# Patient Record
Sex: Male | Born: 2000 | Race: White | Hispanic: No | Marital: Single | Smoking: Never smoker
Health system: Southern US, Community
[De-identification: ages and names within clinical notes are randomized; demographics above are authoritative.]

## PROBLEM LIST (undated history)

## (undated) DIAGNOSIS — Q231 Congenital insufficiency of aortic valve: Secondary | ICD-10-CM

## (undated) DIAGNOSIS — Z8719 Personal history of other diseases of the digestive system: Secondary | ICD-10-CM

## (undated) DIAGNOSIS — F909 Attention-deficit hyperactivity disorder, unspecified type: Secondary | ICD-10-CM

## (undated) DIAGNOSIS — K200 Eosinophilic esophagitis: Secondary | ICD-10-CM

## (undated) DIAGNOSIS — Q249 Congenital malformation of heart, unspecified: Secondary | ICD-10-CM

## (undated) DIAGNOSIS — Q2381 Bicuspid aortic valve: Secondary | ICD-10-CM

## (undated) DIAGNOSIS — R51 Headache: Secondary | ICD-10-CM

## (undated) DIAGNOSIS — A4902 Methicillin resistant Staphylococcus aureus infection, unspecified site: Secondary | ICD-10-CM

## (undated) HISTORY — PX: TYMPANOSTOMY TUBE PLACEMENT: SHX32

## (undated) HISTORY — DX: Personal history of other diseases of the digestive system: Z87.19

## (undated) HISTORY — PX: HERNIA REPAIR: SHX51

## (undated) HISTORY — PX: UPPER GASTROINTESTINAL ENDOSCOPY: SHX188

## (undated) HISTORY — DX: Congenital malformation of heart, unspecified: Q24.9

## (undated) HISTORY — PX: TONSILLECTOMY: SUR1361

## (undated) HISTORY — PX: ADENOIDECTOMY: SUR15

---
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
## 2022-02-25 DIAGNOSIS — K922 Gastrointestinal hemorrhage, unspecified: Secondary | ICD-10-CM

## 2022-02-25 HISTORY — DX: Gastrointestinal hemorrhage, unspecified: K92.2

## 2022-05-20 ENCOUNTER — Telehealth: Payer: Self-pay | Admitting: Internal Medicine

## 2022-05-20 NOTE — Telephone Encounter (Signed)
Ok to schedule with me

## 2022-05-20 NOTE — Telephone Encounter (Signed)
Good Afternoon Dr. Henrene Pastor,  We received a referral for this patient to be seen for GERD and Schatzki's ring of distal esophagus.  The patient has GI hx at [facility] for pediatric GI. The patient is requesting to this transfer because he is no longer and minor and has requested to have you as his provider.  Records are available for review in Epic, Care Everywhere. Please advise on scheduling. Thank you.

## 2022-05-21 ENCOUNTER — Encounter: Payer: Self-pay | Admitting: Internal Medicine

## 2022-05-21 NOTE — Telephone Encounter (Signed)
Patient has been scheduled for 1/25 

## 2022-07-01 ENCOUNTER — Encounter: Payer: Self-pay | Admitting: Internal Medicine

## 2022-07-01 ENCOUNTER — Ambulatory Visit (INDEPENDENT_AMBULATORY_CARE_PROVIDER_SITE_OTHER): Payer: Medicaid Other | Admitting: Internal Medicine

## 2022-07-01 VITALS — BP 120/68 | HR 63 | Ht 66.0 in | Wt 110.0 lb

## 2022-07-01 DIAGNOSIS — Z9889 Other specified postprocedural states: Secondary | ICD-10-CM

## 2022-07-01 DIAGNOSIS — K200 Eosinophilic esophagitis: Secondary | ICD-10-CM | POA: Diagnosis not present

## 2022-07-01 DIAGNOSIS — K222 Esophageal obstruction: Secondary | ICD-10-CM | POA: Diagnosis not present

## 2022-07-01 DIAGNOSIS — R1319 Other dysphagia: Secondary | ICD-10-CM

## 2022-07-01 NOTE — Progress Notes (Signed)
HISTORY OF PRESENT ILLNESS:  Dennis English is a 21 y.o. male, high school graduate, with bicuspid aortic valve, history of cardiac arrhythmia, ADHD, asthma, and eosinophilic esophagitis who presents to this clinic regarding the latter and complaints of chronic recurrent dysphagia.  He is accompanied by his mother.  He is new to this practice.  The patient is reported to have had his initial episode of food impaction requiring medical attention via EGD with removal in 2013.  At one point he was diagnosed with eosinophilic esophagitis and GERD.  No details, but am told that he underwent Nissen fundoplication in February 2018.  This did not help.  He has had chronic recurrent problems with solid food dysphagia and food impactions.  He is undergone multiple endoscopies with dilation.  Most of his care has been at [facility].  For many years he had been on lansoprazole.  Recently changed to pantoprazole 40 mg daily.  No active reflux symptoms.  Review of recent records shows that the patient underwent double contrast upper GI series December 30, 2021.  He was said to have trace gastroesophageal reflux and intermittent moderate paraesophageal hernia.  This was concerning for failure of Nissen fundoplication.  Also, smooth stricture in the distal thoracic esophagus which impeded the passage of a 13 mm barium tablet.  Upper endoscopy last performed February 24, 2022.  He was balloon dilated to 19 mm.  There was mucosal disruption.  Post dilation his dysphagia is improved, typically for a month or so.  Apparently diagnosed with EOE.  He did see his surgeon in August (March 25, 2022).  At that time he was not felt to have any complication regarding his prior hernia repair with Nissen fundoplication.  The recommendation was for him to follow-up with GI regarding his dysphagia.  He aged out of the pediatric setting and was told to find an adult gastroenterologist.  Recent months he has had significant  recurrent dysphagia.  Trouble with foods such as chicken or chicken sandwich.  REVIEW OF SYSTEMS:  All non-GI ROS negative unless otherwise stated in the HPI except for irregular heartbeat.  Past Medical History:  Diagnosis Date   ADHD (attention deficit hyperactivity disorder)    Asthma    Bicuspid aortic valve    Cardiac arrhythmia due to congenital heart disease    Eosinophilic esophagitis    GI bleed 02/2022   Headache(784.0)    MRSA (methicillin resistant Staphylococcus aureus)    age 54, in bloodstream per mother   Status post dilation of esophageal narrowing     Past Surgical History:  Procedure Laterality Date   ADENOIDECTOMY     HERNIA REPAIR     TONSILLECTOMY     TYMPANOSTOMY TUBE PLACEMENT      Social History Dennis English  reports that he has never smoked. He has never used smokeless tobacco. No history on file for alcohol use and drug use.  family history includes ADD / ADHD in his brother; Asthma in his mother; Barrett's esophagus in his mother; Clotting disorder in his sister; Colon cancer in his maternal grandmother; Colon polyps in his maternal grandmother and mother; Diabetes in his brother and maternal grandmother; Heart disease in his maternal grandfather and maternal grandmother.  No Known Allergies     PHYSICAL EXAMINATION: Vital signs: BP 120/68   Pulse 63   Ht 5\' 6"  (1.676 m)   Wt 110 lb (49.9 kg)   SpO2 96%   BMI 17.75 kg/m   Constitutional:  Thin but generally well-appearing, no acute distress Psychiatric: alert and oriented x3, cooperative Eyes: extraocular movements intact, anicteric, conjunctiva pink Mouth: oral pharynx moist, no lesions Neck: supple no lymphadenopathy Cardiovascular: heart regular rate and rhythm, no murmur Lungs: clear to auscultation bilaterally Abdomen: soft, nontender, nondistended, no obvious ascites, no peritoneal signs, normal bowel sounds, no organomegaly Rectal: Omitted Extremities: no clubbing, cyanosis,  or lower extremity edema bilaterally Skin: no lesions on visible extremities Neuro: No focal deficits.  Cranial nerves intact  ASSESSMENT:  1.  Recurrent esophageal stricture with food impaction.  Likely secondary to EOE esophagus.  Question GERD with secondary EOE. 2.  History of hiatal hernia status post hernia repair with Nissen fundoplication 3.  General medical problems as listed 4.  Significant recurrent dysphagia   PLAN:  1.  Continue PPI 2.  Schedule upper endoscopy with biopsies and esophageal dilation.The nature of the procedure, as well as the risks, benefits, and alternatives were carefully and thoroughly reviewed with the patient. Ample time for discussion and questions allowed. The patient understood, was satisfied, and agreed to proceed. 3.  Chew food extremely well in the interim

## 2022-07-01 NOTE — Patient Instructions (Signed)
_______________________________________________________  If you are age 19 or older, your body mass index should be between 23-30. Your Body mass index is 17.75 kg/m. If this is out of the aforementioned range listed, please consider follow up with your Primary Care Provider.  If you are age 47 or younger, your body mass index should be between 19-25. Your Body mass index is 17.75 kg/m. If this is out of the aformentioned range listed, please consider follow up with your Primary Care Provider.   ________________________________________________________  The [facility] providers would like to encourage you to use [facility] to communicate with providers for non-urgent requests or questions.  Due to long hold times on the telephone, sending your provider a message by [facility] may be a faster and more efficient way to get a response.  Please allow 48 business hours for a response.  Please remember that this is for non-urgent requests.  _______________________________________________________  Dennis English have been scheduled for a colonoscopy. Please follow written instructions given to you at your visit today.  Please pick up your prep supplies at the pharmacy within the next 1-3 days. If you use inhalers (even only as needed), please bring them with you on the day of your procedure.

## 2022-07-09 ENCOUNTER — Encounter: Payer: Self-pay | Admitting: Internal Medicine

## 2022-07-09 ENCOUNTER — Ambulatory Visit (AMBULATORY_SURGERY_CENTER): Payer: Medicaid Other | Admitting: Internal Medicine

## 2022-07-09 VITALS — BP 93/55 | HR 67 | Temp 97.8°F | Resp 14 | Ht 66.0 in | Wt 110.0 lb

## 2022-07-09 DIAGNOSIS — R1319 Other dysphagia: Secondary | ICD-10-CM

## 2022-07-09 DIAGNOSIS — Z9889 Other specified postprocedural states: Secondary | ICD-10-CM | POA: Diagnosis not present

## 2022-07-09 DIAGNOSIS — R1314 Dysphagia, pharyngoesophageal phase: Secondary | ICD-10-CM | POA: Diagnosis not present

## 2022-07-09 DIAGNOSIS — K200 Eosinophilic esophagitis: Secondary | ICD-10-CM

## 2022-07-09 DIAGNOSIS — K222 Esophageal obstruction: Secondary | ICD-10-CM

## 2022-07-09 MED ORDER — SODIUM CHLORIDE 0.9 % IV SOLN: 500.0000 mL | Freq: Once | INTRAVENOUS | Status: DC

## 2022-07-09 NOTE — Op Note (Signed)
[facility] Patient Name: Dennis English Procedure Date: 07/09/2022 1:18 PM MRN: [id number] Endoscopist: Wilhemina Bonito. Dennis English , MD, [id number] Age: 21 Referring MD:  Date of Birth: 04-05-2001 Gender: Male Account #: [id number] Procedure:                Upper GI endoscopy with biopsies; Maloney dilation                            of the esophagus-32F,9F Indications:              Dysphagia, Therapeutic procedure Medicines:                Monitored Anesthesia Care Procedure:                Pre-Anesthesia Assessment:                           - Prior to the procedure, a History and Physical                            was performed, and patient medications and                            allergies were reviewed. The patient's tolerance of                            previous anesthesia was also reviewed. The risks                            and benefits of the procedure and the sedation                            options and risks were discussed with the patient.                            All questions were answered, and informed consent                            was obtained. Prior Anticoagulants: The patient has                            taken no anticoagulant or antiplatelet agents. ASA                            Grade Assessment: II - A patient with mild systemic                            disease. After reviewing the risks and benefits,                            the patient was deemed in satisfactory condition to                            undergo the procedure.  After obtaining informed consent, the endoscope was                            passed under direct vision. Throughout the                            procedure, the patient's blood pressure, pulse, and                            oxygen saturations were monitored continuously. The                            GIF [id number] #[id number] was introduced through the                            mouth, and  advanced to the second part of duodenum.                            The upper GI endoscopy was accomplished without                            difficulty. The patient tolerated the procedure                            well. Scope In: Scope Out: Findings:                 Multiple benign-appearing, intrinsic moderate                            stenoses were found throughout the esophagus.                            biopsies taken. The narrowest stenosis measured 1.2                            cm (inner diameter). The scope was withdrawn.                            Dilation was performed with a Maloney dilator with                            no resistance at 42 and 44 Fr. Resistance at 25F                            (incomplete). + heme                           The exam of the esophagus was otherwise normal. GEJ                            at 35 cm.                           The stomach revealed prior fundoplication. slipped  with Hiatal hernia..                           The examined duodenum was normal.                           The cardia and gastric fundus were normal on                            retroflexion, save above. Complications:            No immediate complications. Estimated Blood Loss:     Estimated blood loss: none. Impression:               - Benign-appearing esophageal stenoses. biopsied.                            Dilated.                           - Prior fundoplication slipped; H/H..                           - Normal examined duodenum.                           - No specimens collected. Recommendation:           1. Patient has a contact number available for                            emergencies. The signs and symptoms of potential                            delayed complications were discussed with the                            patient. Return to normal activities tomorrow.                            Written discharge instructions were  provided to the                            patient.                           2. post dilation diet.                           3. Continue present medications.                           4. Await pathology results.                           5, Repeat EGD with dilation in one month Dennis English N. Dennis Goodell, MD 07/09/2022 2:03:30 PM This report has been signed electronically.

## 2022-07-09 NOTE — Patient Instructions (Addendum)
Handout on post dilation diet given to patient. Await pathology results. Continue present medications. Repeat EGD with dilation in one month. Scheduled for January 17th, 2024 @ 4 pm (please arrive at 3pm - instructions included)   YOU HAD AN ENDOSCOPIC PROCEDURE TODAY AT THE [facility]:   Refer to the procedure report that was given to you for any specific questions about what was found during the examination.  If the procedure report does not answer your questions, please call your gastroenterologist to clarify.  If you requested that your care partner not be given the details of your procedure findings, then the procedure report has been included in a sealed envelope for you to review at your convenience later.  YOU SHOULD EXPECT: Some feelings of bloating in the abdomen. Passage of more gas than usual.  Walking can help get rid of the air that was put into your GI tract during the procedure and reduce the bloating. If you had a lower endoscopy (such as a colonoscopy or flexible sigmoidoscopy) you may notice spotting of blood in your stool or on the toilet paper. If you underwent a bowel prep for your procedure, you may not have a normal bowel movement for a few days.  Please Note:  You might notice some irritation and congestion in your nose or some drainage.  This is from the oxygen used during your procedure.  There is no need for concern and it should clear up in a day or so.  SYMPTOMS TO REPORT IMMEDIATELY:   Following upper endoscopy (EGD)  Vomiting of blood or coffee ground material  New chest pain or pain under the shoulder blades  Painful or persistently difficult swallowing  New shortness of breath  Fever of 100F or higher  Black, tarry-looking stools  For urgent or emergent issues, a gastroenterologist can be reached at any hour by calling [phone]. Do not use MyChart messaging for urgent concerns.    DIET:  We do recommend a small meal at first, but then  you may proceed to your regular diet.  Drink plenty of fluids but you should avoid alcoholic beverages for 24 hours.  ACTIVITY:  You should plan to take it easy for the rest of today and you should NOT DRIVE or use heavy machinery until tomorrow (because of the sedation medicines used during the test).    FOLLOW UP: Our staff will call the number listed on your records the next business day following your procedure.  We will call around 7:15- 8:00 am to check on you and address any questions or concerns that you may have regarding the information given to you following your procedure. If we do not reach you, we will leave a message.     If any biopsies were taken you will be contacted by phone or by letter within the next 1-3 weeks.  Please call us at [phone] if you have not heard about the biopsies in 3 weeks.    SIGNATURES/CONFIDENTIALITY: You and/or your care partner have signed paperwork which will be entered into your electronic medical record.  These signatures attest to the fact that that the information above on your After Visit Summary has been reviewed and is understood.  Full responsibility of the confidentiality of this discharge information lies with you and/or your care-partner.

## 2022-07-09 NOTE — Progress Notes (Signed)
Vitals-DT  Pt's states no medical or surgical changes since previsit or office visit.  

## 2022-07-09 NOTE — Progress Notes (Signed)
HISTORY OF PRESENT ILLNESS:   Dennis English is a 21 y.o. male, high school graduate, with bicuspid aortic valve, history of cardiac arrhythmia, ADHD, asthma, and eosinophilic esophagitis who presents to this clinic regarding the latter and complaints of chronic recurrent dysphagia.  He is accompanied by his mother.  He is new to this practice.   The patient is reported to have had his initial episode of food impaction requiring medical attention via EGD with removal in 2013.  At one point he was diagnosed with eosinophilic esophagitis and GERD.  No details, but am told that he underwent Nissen fundoplication in February 2018.  This did not help.  He has had chronic recurrent problems with solid food dysphagia and food impactions.  He is undergone multiple endoscopies with dilation.  Most of his care has been at [facility].  For many years he had been on lansoprazole.  Recently changed to pantoprazole 40 mg daily.  No active reflux symptoms.   Review of recent records shows that the patient underwent double contrast upper GI series December 30, 2021.  He was said to have trace gastroesophageal reflux and intermittent moderate paraesophageal hernia.  This was concerning for failure of Nissen fundoplication.  Also, smooth stricture in the distal thoracic esophagus which impeded the passage of a 13 mm barium tablet.   Upper endoscopy last performed February 24, 2022.  He was balloon dilated to 19 mm.  There was mucosal disruption.  Post dilation his dysphagia is improved, typically for a month or so.  Apparently diagnosed with EOE.   He did see his surgeon in August (March 25, 2022).  At that time he was not felt to have any complication regarding his prior hernia repair with Nissen fundoplication.  The recommendation was for him to follow-up with GI regarding his dysphagia.  He aged out of the pediatric setting and was told to find an adult gastroenterologist.  Recent months he has had  significant recurrent dysphagia.  Trouble with foods such as chicken or chicken sandwich.   REVIEW OF SYSTEMS:   All non-GI ROS negative unless otherwise stated in the HPI except for irregular heartbeat.       Past Medical History:  Diagnosis Date   ADHD (attention deficit hyperactivity disorder)     Asthma     Bicuspid aortic valve     Cardiac arrhythmia due to congenital heart disease     Eosinophilic esophagitis     GI bleed 02/2022   Headache(784.0)     MRSA (methicillin resistant Staphylococcus aureus)      age 47, in bloodstream per mother   Status post dilation of esophageal narrowing             Past Surgical History:  Procedure Laterality Date   ADENOIDECTOMY       HERNIA REPAIR       TONSILLECTOMY       TYMPANOSTOMY TUBE PLACEMENT          Social History ALEXANDRE FARIES  reports that he has never smoked. He has never used smokeless tobacco. No history on file for alcohol use and drug use.   family history includes ADD / ADHD in his brother; Asthma in his mother; Barrett's esophagus in his mother; Clotting disorder in his sister; Colon cancer in his maternal grandmother; Colon polyps in his maternal grandmother and mother; Diabetes in his brother and maternal grandmother; Heart disease in his maternal grandfather and maternal grandmother.   No Known Allergies  PHYSICAL EXAMINATION: Vital signs: BP 120/68   Pulse 63   Ht 5\' 6"  (1.676 m)   Wt 110 lb (49.9 kg)   SpO2 96%   BMI 17.75 kg/m   Constitutional: Thin but generally well-appearing, no acute distress Psychiatric: alert and oriented x3, cooperative Eyes: extraocular movements intact, anicteric, conjunctiva pink Mouth: oral pharynx moist, no lesions Neck: supple no lymphadenopathy Cardiovascular: heart regular rate and rhythm, no murmur Lungs: clear to auscultation bilaterally Abdomen: soft, nontender, nondistended, no obvious ascites, no peritoneal signs, normal bowel sounds, no  organomegaly Rectal: Omitted Extremities: no clubbing, cyanosis, or lower extremity edema bilaterally Skin: no lesions on visible extremities Neuro: No focal deficits.  Cranial nerves intact   ASSESSMENT:   1.  Recurrent esophageal stricture with food impaction.  Likely secondary to EOE esophagus.  Question GERD with secondary EOE. 2.  History of hiatal hernia status post hernia repair with Nissen fundoplication 3.  General medical problems as listed 4.  Significant recurrent dysphagia     PLAN:   1.  Continue PPI 2.  Schedule upper endoscopy with biopsies and esophageal dilation.The nature of the procedure, as well as the risks, benefits, and alternatives were carefully and thoroughly reviewed with the patient. Ample time for discussion and questions allowed. The patient understood, was satisfied, and agreed to proceed. 3.  Chew food extremely well in the interim  ADDENDUM: Upper endoscopy report from January 14, 2022 obtained and reviewed.  Patient was found to have a high-grade esophageal stricture measuring less than 10 mm.  Dilated to 15 mm with mucosal disruption.

## 2022-07-09 NOTE — Progress Notes (Signed)
A and O x3. Report to RN. Tolerated MAC anesthesia well.Teeth unchanged after procedure. 

## 2022-07-09 NOTE — Progress Notes (Signed)
Called to room to assist during endoscopic procedure.  Patient ID and intended procedure confirmed with present staff. Received instructions for my participation in the procedure from the performing physician.  

## 2022-07-10 ENCOUNTER — Telehealth: Payer: Self-pay

## 2022-07-10 NOTE — Telephone Encounter (Signed)
Post procedure follow up call, no answer 

## 2022-07-12 ENCOUNTER — Ambulatory Visit
Admission: EM | Admit: 2022-07-12 | Discharge: 2022-07-12 | Disposition: A | Discharge status: Home / Self Care | Payer: Medicaid Other | Attending: Internal Medicine | Admitting: Internal Medicine

## 2022-07-12 DIAGNOSIS — N3001 Acute cystitis with hematuria: Secondary | ICD-10-CM | POA: Insufficient documentation

## 2022-07-12 DIAGNOSIS — R300 Dysuria: Secondary | ICD-10-CM | POA: Diagnosis present

## 2022-07-12 DIAGNOSIS — U071 COVID-19: Secondary | ICD-10-CM | POA: Insufficient documentation

## 2022-07-12 LAB — POCT URINALYSIS DIP (MANUAL ENTRY)
Glucose, UA: NEGATIVE mg/dL
Nitrite, UA: POSITIVE — AB
Protein Ur, POC: >=300 mg/dL — AB
Spec Grav, UA: >=1.03 — AB (ref 1.010–1.025)
Urobilinogen, UA: 1 E.U./dL
pH, UA: 6 (ref 5.0–8.0)

## 2022-07-12 MED ORDER — ACETAMINOPHEN 325 MG PO TABS
650.0000 mg | ORAL_TABLET | Freq: Once | ORAL | Status: AC
  Administered 2022-07-12: 650 mg via ORAL

## 2022-07-12 MED ORDER — SODIUM CHLORIDE 0.9 % IV BOLUS
1000.0000 mL | Freq: Once | INTRAVENOUS | Status: AC
  Administered 2022-07-12: 1000 mL via INTRAVENOUS

## 2022-07-12 MED ORDER — SULFAMETHOXAZOLE-TRIMETHOPRIM 800-160 MG PO TABS: 1.0000 | ORAL_TABLET | Freq: Two times a day (BID) | ORAL | 0 refills | Status: AC

## 2022-07-12 NOTE — Discharge Instructions (Signed)
COVID-19 is treated with supportive care and symptom management as we discussed.  If symptoms persist or worsen, please follow-up for further evaluation and management.  Recommend that you drink plenty of fluids and get rest.  It appears that he has a urinary tract infection so this is being treated with an antibiotic.  Urine culture is pending.  Will call with results.  Please follow-up if these symptoms persist or worsen as well.

## 2022-07-12 NOTE — ED Triage Notes (Addendum)
Pt presents to [facility] with mother, pt reports cough congestion and ha for one week and new onset of dysuria since last night, pt denies any chance for STI but did state he had some penile discharge.    Pt is covid + per mother

## 2022-07-12 NOTE — ED Provider Notes (Signed)
[facility]    CSN: [id number] Arrival date & time: 07/12/22  1134      History   Chief Complaint Chief Complaint  Patient presents with   Dysuria   URI    HPI Dennis English is a 21 y.o. male.   Patient presents with 2 different chief complaints today.  Patient reports that he recently tested positive for COVID-19 at PCP office visit approximately 3 days ago.  Patient reports that no medications were prescribed at that time.  He has been using over-the-counter cold and flu medications with temporary improvement in symptoms.  Patient denies history of asthma.  Reports fever at home but is not sure temp max.  He denies chest pain, shortness of breath, sore throat, ear pain, nausea, vomiting, diarrhea, abdominal pain.  Grandfather also tested positive for COVID-19.  Patient reporting urinary burning, urinary hesitancy, urinary frequency that started yesterday.  Patient also reports some penile discharge.  Denies abdominal pain, back pain, fever, testicular pain.  Patient adamantly declines that he is sexually active or has any confirmed exposure to STD.  Denies history of frequent urinary tract infection.  Patient reports that he has been taking baths recently since he has been sick so is not sure if the symptoms are related to that.   Dysuria URI   Past Medical History:  Diagnosis Date   ADHD (attention deficit hyperactivity disorder)    Asthma    Bicuspid aortic valve    Cardiac arrhythmia due to congenital heart disease    Eosinophilic esophagitis    GI bleed 02/2022   Headache(784.0)    MRSA (methicillin resistant Staphylococcus aureus)    age 55, in bloodstream per mother   Status post dilation of esophageal narrowing     Patient Active Problem List   Diagnosis Date Noted   Nausea with vomiting    Migraine without aura, without mention of intractable migraine without mention of status migrainosus 01/13/2013   Persistent vomiting 01/13/2013   Variants  of migraine, not elsewhere classified, without mention of intractable migraine without mention of status migrainosus 01/13/2013   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 Authorizing Provider  sulfamethoxazole-trimethoprim (BACTRIM DS) 800-160 MG tablet Take 1 tablet by mouth 2 (two) times daily for 7 days. 07/12/22 07/19/22 Yes Alyxander Kollmann, Acie Fredrickson, FNP  cyproheptadine (PERIACTIN) 4 MG tablet Take 4 mg by mouth 2 (two) times daily. 07/17/21   [provider]  lansoprazole (PREVACID) 30 MG capsule Take 30 mg by mouth 2 (two) times daily. 07/17/21   [provider]  lisdexamfetamine (VYVANSE) 20 MG capsule Take 20 mg by mouth daily. Patient not taking: Reported on 08/12/2021    [provider]  loratadine (CLARITIN) 10 MG tablet Take 10 mg by mouth daily as needed. 04/29/21   [provider]  ondansetron (ZOFRAN ODT) 4 MG disintegrating tablet Take 1 tablet (4 mg total) by mouth every 6 (six) hours as needed for nausea or vomiting. 09/06/17   Lowanda Foster, NP  pantoprazole (PROTONIX) 40 MG tablet Take 40 mg by mouth 2 (two) times daily.    [provider]  PROAIR HFA 108 [phone] Base) MCG/ACT inhaler 2 Puff(s) By Mouth Every 4-6 Hours PRN Patient not taking: Reported on 07/01/2022 04/28/21   [provider]  UNKNOWN TO PATIENT Take 1 capsule by mouth daily. Patient not taking: Reported on 08/12/2021    [provider]    Family History Family History  Problem Relation Age of Onset   Asthma Mother     Colon polyps Mother    Barrett's esophagus Mother    Clotting disorder Sister    ADD / ADHD Brother    Diabetes Brother    Heart disease Maternal Grandmother    Colon cancer Maternal Grandmother    Colon polyps Maternal Grandmother    Diabetes Maternal Grandmother    Heart disease Maternal Grandfather    Esophageal cancer Neg Hx    Liver disease Neg Hx     Social History Social History   Tobacco Use   Smoking status: Never   Smokeless tobacco: Never  Vaping Use   Vaping Use: Never used  Substance Use Topics   Alcohol use: Never   Drug use: Never     Allergies   Patient has no known allergies.   Review of Systems Review of Systems Per HPI  Physical Exam Triage Vital Signs ED Triage Vitals  Enc Vitals Group     BP 07/12/22 1353 118/83     Pulse Rate 07/12/22 1353 (!) 121     Resp 07/12/22 1353 16     Temp 07/12/22 1353 99.6 F (37.6 C)     Temp Source 07/12/22 1353 Oral     SpO2 07/12/22 1353 98 %     Weight --      Height --      Head Circumference --      Peak Flow --      Pain Score 07/12/22 1355 5     Pain Loc --      Pain Edu? --      Excl. in [place]? --    No data found.  Updated Vital Signs BP 117/73   Pulse (!) 106   Temp (!) 100.5 F (38.1 C)   Resp 15   SpO2 98%   Visual Acuity Right Eye Distance:   Left Eye Distance:   Bilateral Distance:    Right Eye Near:   Left Eye Near:    Bilateral Near:     Physical Exam Constitutional:      General: He is not in acute distress.    Appearance: Normal appearance. He is not toxic-appearing or diaphoretic.  HENT:     Head: Normocephalic and atraumatic.     Right Ear: Tympanic membrane and ear canal normal.     Left Ear: Tympanic membrane and ear canal normal.     Nose: Congestion present.     Mouth/Throat:     Mouth: Mucous membranes are moist.     Pharynx: No posterior oropharyngeal erythema.  Eyes:     Extraocular Movements: Extraocular movements intact.     Conjunctiva/sclera:  Conjunctivae normal.     Pupils: Pupils are equal, round, and reactive to light.  Cardiovascular:     Rate and Rhythm: Regular rhythm. Tachycardia present.     Pulses: Normal pulses.     Heart sounds: Normal heart sounds.  Pulmonary:     Effort: Pulmonary effort is normal. No respiratory distress.  Breath sounds: Normal breath sounds. No stridor. No wheezing, rhonchi or rales.  Abdominal:     General: Abdomen is flat. Bowel sounds are normal.     Palpations: Abdomen is soft.  Musculoskeletal:        General: Normal range of motion.     Cervical back: Normal range of motion.  Skin:    General: Skin is warm and dry.  Neurological:     General: No focal deficit present.     Mental Status: He is alert and oriented to person, place, and time. Mental status is at baseline.  Psychiatric:        Mood and Affect: Mood normal.        Behavior: Behavior normal.      UC Treatments / Results  Labs (all labs ordered are listed, but only abnormal results are displayed) Labs Reviewed  BASIC METABOLIC PANEL - Abnormal; Notable for the following components:      Result Value   Creatinine, Ser 0.71 (*)    CO2 19 (*)    All other components within normal limits   Narrative:    Performed at:  [address], [place], [place]  [id number] Lab Director: Rush Farmer MD, Phone:  [phone]  CBC - Abnormal; Notable for the following components:   WBC 12.4 (*)    All other components within normal limits   Narrative:    Performed at:  [address] [place], [place]  [id number] Lab Director: Rush Farmer MD, Phone:  [phone]  POCT URINALYSIS DIP (MANUAL ENTRY) - Abnormal; Notable for the following components:   Clarity, UA cloudy (*)    Bilirubin, UA small (*)    Ketones, POC UA large (80) (*)    Spec Grav, UA >=1.030 (*)    Blood, UA large (*)    Protein Ur, POC >=300 (*)    Nitrite, UA Positive (*)    Leukocytes, UA Small (1+) (*)     All other components within normal limits  URINE CULTURE    EKG   Radiology No results found.  Procedures Procedures (including critical care time)  Medications Ordered in UC Medications  acetaminophen (TYLENOL) tablet 650 mg (650 mg Oral Given 07/12/22 1422)  sodium chloride 0.9 % bolus 1,000 mL (0 mLs Intravenous Stopped 07/12/22 1554)    Initial Impression / Assessment and Plan / UC Course  I have reviewed the triage vital signs and the nursing notes.  Pertinent labs & imaging results that were available during my care of the patient were reviewed by me and considered in my medical decision making (see chart for details).     Discussed patient's clinical course and treatment with supervising physician Dr. Lanny Cramp. Patient tested positive for COVID-19 at PCP visit approximately 3 days ago.  Symptoms started around the same time per patient.  Discussed COVID antivirals with patient but patient and parent adamantly declines COVID antivirals.  Therefore, discussed symptomatic care and supportive management for COVID-19 with patient.  Given mild tachycardia noted, advised to avoid over-the-counter medications that can cause tachycardia and discussed these names with patient and parent.  Patient is mildly tachycardic with mild temp in urgent care today.  Originally suspected that tachycardia was due to temp but after Tylenol administration, heart rate did not decrease.  Therefore, normal saline bolus was administered due to concerns of dehydration.  All vital signs especially heart rate improved with normal saline bolus.  Therefore, patient is stable for  discharge.  Advised patient of the importance of ensuring adequate fluid hydration as well.  Fever monitoring and management discussed with patient as well.  BMP and CBC pending as well.  UA showing signs of UTI.  Patient is adamant that he is not sexually active so cytology swab was deferred.  Will treat with Bactrim.  Urine culture is  pending.  Patient reported that he has been taking baths over the past few days so suspect this may be related to UTI.  Although, did discuss with patient and parent at bedside that it is very rare for an adult male to get UTI so if symptoms persist or worsen it is highly recommended that he follows up with PCP and/or specialist.  Also advised ensuring adequate fluid hydration for this as well.  Patient was given strict return and ER precautions for all chief complaints today.  Patient and parent at bedside verbalized understanding and were agreeable with plan. Final Clinical Impressions(s) / UC Diagnoses   Final diagnoses:  Acute cystitis with hematuria  COVID-19  Dysuria     Discharge Instructions      COVID-19 is treated with supportive care and symptom management as we discussed.  If symptoms persist or worsen, please follow-up for further evaluation and management.  Recommend that you drink plenty of fluids and get rest.  It appears that he has a urinary tract infection so this is being treated with an antibiotic.  Urine culture is pending.  Will call with results.  Please follow-up if these symptoms persist or worsen as well.    ED Prescriptions     Medication Sig Dispense Auth. Provider   sulfamethoxazole-trimethoprim (BACTRIM DS) 800-160 MG tablet Take 1 tablet by mouth 2 (two) times daily for 7 days. 14 tablet [place], Michele Rockers, [place]      PDMP not reviewed this encounter.   Teodora Medici, [place] 07/13/22 [phone]

## 2022-07-13 LAB — BASIC METABOLIC PANEL
BUN/Creatinine Ratio: 10 (ref 9–20)
BUN: 7 mg/dL (ref 6–20)
CO2: 19 mmol/L — ABNORMAL LOW (ref 20–29)
Calcium: 9.3 mg/dL (ref 8.7–10.2)
Chloride: 96 mmol/L (ref 96–106)
Creatinine, Ser: 0.71 mg/dL — ABNORMAL LOW (ref 0.76–1.27)
Glucose: 84 mg/dL (ref 70–99)
Potassium: 3.9 mmol/L (ref 3.5–5.2)
Sodium: 134 mmol/L (ref 134–144)
eGFR: 136 mL/min/{1.73_m2} (ref >59)

## 2022-07-13 LAB — CBC
Hematocrit: 44.8 % (ref 37.5–51.0)
Hemoglobin: 15 g/dL (ref 13.0–17.7)
MCH: 28.1 pg (ref 26.6–33.0)
MCHC: 33.5 g/dL (ref 31.5–35.7)
MCV: 84 fL (ref 79–97)
Platelets: 203 10*3/uL (ref 150–450)
RBC: 5.33 x10E6/uL (ref 4.14–5.80)
RDW: 12.7 % (ref 11.6–15.4)
WBC: 12.4 10*3/uL — ABNORMAL HIGH (ref 3.4–10.8)

## 2022-07-14 LAB — URINE CULTURE: Culture: >=100000 — AB

## 2022-07-19 ENCOUNTER — Encounter: Payer: Self-pay | Admitting: Internal Medicine

## 2022-08-07 ENCOUNTER — Other Ambulatory Visit (HOSPITAL_COMMUNITY): Payer: Self-pay | Admitting: Pediatrics

## 2022-08-07 ENCOUNTER — Telehealth: Payer: Self-pay | Admitting: Internal Medicine

## 2022-08-07 DIAGNOSIS — R319 Hematuria, unspecified: Secondary | ICD-10-CM

## 2022-08-07 NOTE — Telephone Encounter (Signed)
The pt is having some urinary symptoms and wants to make sure he should keep EGD as planned for next week.  I have confirmed that he should keep appt as scheduled.  The urinary work up will not interfere with EGD. The pt has been advised of the information and verbalized understanding.

## 2022-08-07 NOTE — Telephone Encounter (Signed)
Patient had blood done today and also will have a ultrasound of the kidneys. Calling to inquire if they should follow through with procedure. Please advise.   Thank you

## 2022-08-11 ENCOUNTER — Ambulatory Visit (HOSPITAL_COMMUNITY)
Admission: RE | Admit: 2022-08-11 | Discharge: 2022-08-11 | Disposition: A | Discharge status: Home / Self Care | Payer: Medicaid Other | Source: Ambulatory Visit | Attending: Pediatrics | Admitting: Pediatrics

## 2022-08-11 DIAGNOSIS — R319 Hematuria, unspecified: Secondary | ICD-10-CM | POA: Insufficient documentation

## 2022-08-13 ENCOUNTER — Encounter: Payer: Self-pay | Admitting: Internal Medicine

## 2022-08-13 ENCOUNTER — Ambulatory Visit (AMBULATORY_SURGERY_CENTER): Payer: Medicaid Other | Admitting: Internal Medicine

## 2022-08-13 VITALS — BP 107/69 | HR 67 | Temp 98.9°F | Resp 11 | Ht 66.0 in | Wt 110.0 lb

## 2022-08-13 DIAGNOSIS — R1319 Other dysphagia: Secondary | ICD-10-CM

## 2022-08-13 DIAGNOSIS — K219 Gastro-esophageal reflux disease without esophagitis: Secondary | ICD-10-CM | POA: Diagnosis not present

## 2022-08-13 DIAGNOSIS — K449 Diaphragmatic hernia without obstruction or gangrene: Secondary | ICD-10-CM

## 2022-08-13 DIAGNOSIS — K222 Esophageal obstruction: Secondary | ICD-10-CM

## 2022-08-13 MED ORDER — SODIUM CHLORIDE 0.9 % IV SOLN: 500.0000 mL | Freq: Once | INTRAVENOUS | Status: DC

## 2022-08-13 NOTE — Progress Notes (Signed)
VS completed by CW.   Pt's states no medical or surgical changes since previsit or office visit.  

## 2022-08-13 NOTE — Progress Notes (Signed)
Called to room to assist during endoscopic procedure.  Patient ID and intended procedure confirmed with present staff. Received instructions for my participation in the procedure from the performing physician.

## 2022-08-13 NOTE — Op Note (Signed)
[facility] Patient Name: Cary Lothrop Procedure Date: 08/13/2022 3:34 PM MRN: [id number] Endoscopist: Wilhemina Bonito. [name] , MD, [id number] Age: 22 Referring MD:  Date of Birth: 11/13/2000 Gender: Male Account #: [id number] Procedure:                Upper GI endoscopy with [facility] dilation 6, 39 F Indications:              Dysphagia, Therapeutic procedure, Esophageal reflux Medicines:                Monitored Anesthesia Care Procedure:                Pre-Anesthesia Assessment:                           - Prior to the procedure, a History and Physical                            was performed, and patient medications and                            allergies were reviewed. The patient's tolerance of                            previous anesthesia was also reviewed. The risks                            and benefits of the procedure and the sedation                            options and risks were discussed with the patient.                            All questions were answered, and informed consent                            was obtained. Prior Anticoagulants: The patient has                            taken no anticoagulant or antiplatelet agents. ASA                            Grade Assessment: II - A patient with mild systemic                            disease. After reviewing the risks and benefits,                            the patient was deemed in satisfactory condition to                            undergo the procedure.                           After obtaining informed consent, the endoscope was  passed under direct vision. Throughout the                            procedure, the patient's blood pressure, pulse, and                            oxygen saturations were monitored continuously. The                            GIF [id number] #[id number] was introduced through the                            mouth, and advanced to the second part of duodenum.                             The upper GI endoscopy was accomplished without                            difficulty. The patient tolerated the procedure                            well. Scope In: Scope Out: Findings:                 A few benign-appearing, intrinsic moderate stenoses                            were found 35 cm from the incisors. The narrowest                            stenosis measured 1.4 cm (inner diameter). fter the                            endoscopic survey was completed, The scope was                            withdrawn. Dilation was performed with a Maloney                            dilator with mild resistance at 25 Fr., then 46 Fr.                            The dilation site was examined following endoscope                            reinsertion and showed mild mucosal disruption at                            the GEJ.                           The exam of the esophagus was otherwise normal.                           The  stomach was normal. Small hiatal hernia and                            prior fundoplication.                           The examined duodenum was normal.                           The cardia and gastric fundus were normal on                            retroflexion. Complications:            No immediate complications. Estimated Blood Loss:     Estimated blood loss: none. Impression:               - Benign-appearing esophageal stenoses. Dilated.                           - Normal stomach save H/H and prior NF.                           - Normal examined duodenum.                           - No specimens collected. Recommendation:           - Patient has a contact number available for                            emergencies. The signs and symptoms of potential                            delayed complications were discussed with the                            patient. Return to normal activities tomorrow.                            Written  discharge instructions were provided to the                            patient.                           - Post dilation diet.                           - Continue present medications. Take your                            Pantoprazole 40 mg twice daily                           - REPEAT EGD WITH DILATION IN [facility] IN 4 WEEKS Cedar Ditullio N. Henrene Pastor, MD 08/13/2022 3:59:26 PM This report has been signed electronically.

## 2022-08-13 NOTE — Patient Instructions (Addendum)
Repeat EGD in 4 weeks.  Appointment made for 09/09/22 @ 4:00pm.  Arrive at 3:00pm.  Handouts on esophageal stricture and dilation, and the post esophageal dilation diet (with instructions) provided.  YOU HAD AN ENDOSCOPIC PROCEDURE TODAY AT [facility]:   Refer to the procedure report that was given to you for any specific questions about what was found during the examination.  If the procedure report does not answer your questions, please call your gastroenterologist to clarify.  If you requested that your care partner not be given the details of your procedure findings, then the procedure report has been included in a sealed envelope for you to review at your convenience later.  YOU SHOULD EXPECT: Some feelings of bloating in the abdomen. Passage of more gas than usual.  Walking can help get rid of the air that was put into your GI tract during the procedure and reduce the bloating. If you had a lower endoscopy (such as a colonoscopy or flexible sigmoidoscopy) you may notice spotting of blood in your stool or on the toilet paper. If you underwent a bowel prep for your procedure, you may not have a normal bowel movement for a few days.  Please Note:  You might notice some irritation and congestion in your nose or some drainage.  This is from the oxygen used during your procedure.  There is no need for concern and it should clear up in a day or so.  SYMPTOMS TO REPORT IMMEDIATELY:   Following upper endoscopy (EGD)  Vomiting of blood or coffee ground material  New chest pain or pain under the shoulder blades  Painful or persistently difficult swallowing  New shortness of breath  Fever of 100F or higher  Black, tarry-looking stools  For urgent or emergent issues, a gastroenterologist can be reached at any hour by calling [phone]. Do not use MyChart messaging for urgent concerns.    DIET:  Nothing by mouth until 5:00pm.  Clear liquids from 5:00pm - 6:00pm.  Soft diet  after 6:00pm and for the rest of today.  You may proceed to your regular diet.  Drink plenty of fluids but you should avoid alcoholic beverages for 24 hours.  ACTIVITY:  You should plan to take it easy for the rest of today and you should NOT DRIVE or use heavy machinery until tomorrow (because of the sedation medicines used during the test).    FOLLOW UP: Our staff will call the number listed on your records the next business day following your procedure.  We will call around 7:15- 8:00 am to check on you and address any questions or concerns that you may have regarding the information given to you following your procedure. If we do not reach you, we will leave a message.     If any biopsies were taken you will be contacted by phone or by letter within the next 1-3 weeks.  Please call us at [phone] if you have not heard about the biopsies in 3 weeks.    SIGNATURES/CONFIDENTIALITY: You and/or your care partner have signed paperwork which will be entered into your electronic medical record.  These signatures attest to the fact that that the information above on your After Visit Summary has been reviewed and is understood.  Full responsibility of the confidentiality of this discharge information lies with you and/or your care-partner.

## 2022-08-13 NOTE — Progress Notes (Signed)
Pt in recovery with monitors in place, VSS. Report given to receiving RN. Bite guard was placed with pt awake to ensure comfort. No dental or soft tissue damage noted. 

## 2022-08-13 NOTE — Progress Notes (Signed)
HISTORY OF PRESENT ILLNESS:  Dennis English is a 22 y.o. 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  Significant stricturing of the esophagus which has required periodic dilation.  Last seen 1 month ago and was dilated to a maximal diameter of 44 French (46 resistant).  He is back today for repeat endoscopy with dilation  REVIEW OF SYSTEMS:  All non-GI ROS negative .  Past Medical History:  Diagnosis Date   ADHD (attention deficit hyperactivity disorder)    Asthma    Bicuspid aortic valve    Cardiac arrhythmia due to congenital heart disease    Eosinophilic esophagitis    GI bleed 02/2022   Headache(784.0)    MRSA (methicillin resistant Staphylococcus aureus)    age 38, in bloodstream per mother   Status post dilation of esophageal narrowing     Past Surgical History:  Procedure Laterality Date   ADENOIDECTOMY     HERNIA REPAIR     TONSILLECTOMY     TYMPANOSTOMY TUBE PLACEMENT     UPPER GASTROINTESTINAL ENDOSCOPY      Social History Dennis English  reports that he has never smoked. He has never used smokeless tobacco. He reports that he does not drink alcohol and does not use drugs.  family history includes ADD / ADHD in his brother; Asthma in his mother; Barrett's esophagus in his mother; Clotting disorder in his sister; Colon cancer in his maternal grandmother; Colon polyps in his maternal grandmother and mother; Diabetes in his brother and maternal grandmother; Heart disease in his maternal grandfather and maternal grandmother.  No Known Allergies     PHYSICAL EXAMINATION: Vital signs: BP 127/76   Pulse 60   Temp 98.9 F (37.2 C) (Temporal)   Ht 5\' 6"  (1.676 m)   Wt 110 lb (49.9 kg)   SpO2 100%   BMI 17.75 kg/m  General: Well-developed, well-nourished, no acute distress HEENT: Sclerae are anicteric, conjunctiva pink. Oral mucosa intact Lungs: Clear Heart: Regular Abdomen: soft, nontender, nondistended, no obvious ascites, no  peritoneal signs, normal bowel sounds. No organomegaly. Extremities: No edema Psychiatric: alert and oriented x3. Cooperative     ASSESSMENT:  1.  GERD with esophageal eosinophilia 2.  Stricturing of the esophagus with dysphagia   PLAN:  1.  EGD with dilation 2.  Continue PPI

## 2022-08-14 ENCOUNTER — Telehealth: Payer: Self-pay

## 2022-08-14 NOTE — Telephone Encounter (Signed)
  Follow up Call-     08/13/2022    3:14 PM 07/09/2022   12:30 PM  Call back number  Post procedure Call Back phone  # [phone]  Dad  Permission to leave phone message Yes Yes     Patient questions:  Do you have a fever, pain , or abdominal swelling? No. Pain Score  0 *  Have you tolerated food without any problems? Yes.    Have you been able to return to your normal activities? Yes.    Do you have any questions about your discharge instructions: Diet   No. Medications  No. Follow up visit  No.  Do you have questions or concerns about your Care? No.  Actions: * If pain score is 4 or above: No action needed, pain <4.

## 2022-09-05 ENCOUNTER — Ambulatory Visit: Payer: Medicaid Other | Admitting: Cardiovascular Disease

## 2022-09-09 ENCOUNTER — Encounter: Payer: Self-pay | Admitting: Internal Medicine

## 2022-09-09 ENCOUNTER — Ambulatory Visit (AMBULATORY_SURGERY_CENTER): Payer: Medicaid Other | Admitting: Internal Medicine

## 2022-09-09 VITALS — BP 103/52 | HR 64 | Temp 98.0°F | Resp 14 | Ht 66.0 in | Wt 110.0 lb

## 2022-09-09 DIAGNOSIS — Z9889 Other specified postprocedural states: Secondary | ICD-10-CM

## 2022-09-09 DIAGNOSIS — R1310 Dysphagia, unspecified: Secondary | ICD-10-CM

## 2022-09-09 DIAGNOSIS — R1319 Other dysphagia: Secondary | ICD-10-CM

## 2022-09-09 DIAGNOSIS — K222 Esophageal obstruction: Secondary | ICD-10-CM

## 2022-09-09 DIAGNOSIS — K200 Eosinophilic esophagitis: Secondary | ICD-10-CM

## 2022-09-09 MED ORDER — SODIUM CHLORIDE 0.9 % IV SOLN: 500.0000 mL | INTRAVENOUS | Status: DC

## 2022-09-09 NOTE — Progress Notes (Signed)
HISTORY OF PRESENT ILLNESS:  Dennis English is a 22 y.o. 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  On twice daily PPI.  Undergoing frequent dilations.  Most recent dilation 4 weeks ago to a maximal diameter of 46 [place].  Now for follow-up  REVIEW OF SYSTEMS:  All non-GI ROS negative .  Past Medical History:  Diagnosis Date   ADHD (attention deficit hyperactivity disorder)    Asthma    Bicuspid aortic valve    Cardiac arrhythmia due to congenital heart disease    Eosinophilic esophagitis    GI bleed 02/2022   Headache(784.0)    MRSA (methicillin resistant Staphylococcus aureus)    age 89, in bloodstream per mother   Status post dilation of esophageal narrowing     Past Surgical History:  Procedure Laterality Date   ADENOIDECTOMY     HERNIA REPAIR     TONSILLECTOMY     TYMPANOSTOMY TUBE PLACEMENT     UPPER GASTROINTESTINAL ENDOSCOPY      Social History Dennis English  reports that he has never smoked. He has never used smokeless tobacco. He reports that he does not drink alcohol and does not use drugs.  family history includes ADD / ADHD in his brother; Asthma in his mother; Barrett's esophagus in his mother; Clotting disorder in his sister; Colon cancer in his maternal grandmother; Colon polyps in his maternal grandmother and mother; Diabetes in his brother and maternal grandmother; Heart disease in his maternal grandfather and maternal grandmother.  No Known Allergies     PHYSICAL EXAMINATION: Vital signs: BP 126/69   Pulse 61   Temp 98 F (36.7 C)   Ht 5' 6"$  (1.676 m)   Wt 110 lb (49.9 kg)   SpO2 100%   BMI 17.75 kg/m  General: Well-developed, well-nourished, no acute distress HEENT: Sclerae are anicteric, conjunctiva pink. Oral mucosa intact Lungs: Clear Heart: Regular Abdomen: soft, nontender, nondistended, no obvious ascites, no peritoneal signs, normal bowel sounds. No organomegaly. Extremities: No  edema Psychiatric: alert and oriented x3. Cooperative     ASSESSMENT:  GERD complicated by peptic stricture with EOE type esophagus and recurrent severe dysphagia   PLAN:  EGD with dilation

## 2022-09-09 NOTE — Patient Instructions (Signed)
YOU HAD AN ENDOSCOPIC PROCEDURE TODAY AT [facility]:   Refer to the procedure report that was given to you for any specific questions about what was found during the examination.  If the procedure report does not answer your questions, please call your gastroenterologist to clarify.  If you requested that your care partner not be given the details of your procedure findings, then the procedure report has been included in a sealed envelope for you to review at your convenience later.  YOU SHOULD EXPECT: Some feelings of bloating in the abdomen. Passage of more gas than usual.  Walking can help get rid of the air that was put into your GI tract during the procedure and reduce the bloating. If you had a lower endoscopy (such as a colonoscopy or flexible sigmoidoscopy) you may notice spotting of blood in your stool or on the toilet paper. If you underwent a bowel prep for your procedure, you may not have a normal bowel movement for a few days.  Please Note:  You might notice some irritation and congestion in your nose or some drainage.  This is from the oxygen used during your procedure.  There is no need for concern and it should clear up in a day or so.  SYMPTOMS TO REPORT IMMEDIATELY:  Following upper endoscopy (EGD)  Vomiting of blood or coffee ground material  New chest pain or pain under the shoulder blades  Painful or persistently difficult swallowing  New shortness of breath  Fever of 100F or higher  Black, tarry-looking stools  For urgent or emergent issues, a gastroenterologist can be reached at any hour by calling [phone]. Do not use MyChart messaging for urgent concerns.    DIET:  Follow Dilation diet for today. Drink plenty of fluids but you should avoid alcoholic beverages for 24 hours.  ACTIVITY:  You should plan to take it easy for the rest of today and you should NOT DRIVE or use heavy machinery until tomorrow (because of the sedation medicines used during  the test).    FOLLOW UP: Our staff will call the number listed on your records the next business day following your procedure.  We will call around 7:15- 8:00 am to check on you and address any questions or concerns that you may have regarding the information given to you following your procedure. If we do not reach you, we will leave a message.     If any biopsies were taken you will be contacted by phone or by letter within the next 1-3 weeks.  Please call us at [phone] if you have not heard about the biopsies in 3 weeks.    SIGNATURES/CONFIDENTIALITY: You and/or your care partner have signed paperwork which will be entered into your electronic medical record.  These signatures attest to the fact that that the information above on your After Visit Summary has been reviewed and is understood.  Full responsibility of the confidentiality of this discharge information lies with you and/or your care-partner.

## 2022-09-09 NOTE — Op Note (Signed)
[facility] Patient Name: Dennis English Procedure Date: 09/09/2022 3:15 PM MRN: [id number] Endoscopist: Docia Chuck. Henrene Pastor , MD, [id number] Age: 22 Referring MD:  Date of Birth: Aug 09, 2000 Gender: Male Account #: [id number] Procedure:                Upper GI endoscopy with [facility] dilation of the                            esophagus. 53 [place] Indications:              Dysphagia, Therapeutic procedure Medicines:                Monitored Anesthesia Care Procedure:                Pre-Anesthesia Assessment:                           - Prior to the procedure, a History and Physical                            was performed, and patient medications and                            allergies were reviewed. The patient's tolerance of                            previous anesthesia was also reviewed. The risks                            and benefits of the procedure and the sedation                            options and risks were discussed with the patient.                            All questions were answered, and informed consent                            was obtained. Prior Anticoagulants: The patient has                            taken no anticoagulant or antiplatelet agents. ASA                            Grade Assessment: II - A patient with mild systemic                            disease. After reviewing the risks and benefits,                            the patient was deemed in satisfactory condition to                            undergo the procedure.  After obtaining informed consent, the endoscope was                            passed under direct vision. Throughout the                            procedure, the patient's blood pressure, pulse, and                            oxygen saturations were monitored continuously. The                            GIF [id number] was introduced through the                            mouth, and advanced to  the second part of duodenum.                            The upper GI endoscopy was accomplished without                            difficulty. The patient tolerated the procedure                            well. Scope In: Scope Out: Findings:                 The proximal esophagus had improved diameter from                            previous examinations without significant                            stricturing.                           Benign-appearing, intrinsic moderate stenosis was                            found 38 cm from the incisors (GE junction). This                            stenosis measured 1.4 cm (inner diameter). After                            completing the entire endoscopic survey, the scope                            was withdrawn. Dilation was performed with a                            Maloney dilator with no resistance at 48 Fr..                            Reexamination revealed mucosal disruption of the  distal stricture.                           The stomach was normal. Prior fundoplication. Small                            hiatal hernia.                           The examined duodenum was normal.                           The cardia and gastric fundus were normal on                            retroflexion. Complications:            No immediate complications. Estimated Blood Loss:     Estimated blood loss: none. Impression:               1. GERD with esophageal stricturing and secondary                            EOE status post dilation                           2. History of fundoplication                           3. Small hiatal hernia. Otherwise unremarkable EGD. Recommendation:           - Patient has a contact number available for                            emergencies. The signs and symptoms of potential                            delayed complications were discussed with the                            patient. Return to  normal activities tomorrow.                            Written discharge instructions were provided to the                            patient.                           - Post dilation diet.                           - Continue present medications. Take acid reflux                            medicine twice daily every day                           -  Office follow-up with Dr. Henrene Pastor in 3 months Docia Chuck. Henrene Pastor, MD 09/09/2022 3:45:10 PM This report has been signed electronically.

## 2022-09-09 NOTE — Progress Notes (Signed)
Vss nad trans to pacu °

## 2022-09-10 ENCOUNTER — Telehealth: Payer: Self-pay | Admitting: *Deleted

## 2022-09-10 NOTE — Telephone Encounter (Signed)
  Follow up Call-     09/09/2022    2:57 PM 08/13/2022    3:14 PM 07/09/2022   12:30 PM  Call back number  Post procedure Call Back phone  # [phone]  Dad  Permission to leave phone message Yes Yes Yes     Patient questions:  Do you have a fever, pain , or abdominal swelling? No. Pain Score  0 *  Have you tolerated food without any problems? Yes.    Have you been able to return to your normal activities? Yes.    Do you have any questions about your discharge instructions: Diet   No. Medications  No. Follow up visit  No.  Do you have questions or concerns about your Care? No.  Actions: * If pain score is 4 or above: No action needed, pain <4.

## 2022-09-14 ENCOUNTER — Encounter: Payer: Self-pay | Admitting: Cardiovascular Disease

## 2022-09-14 NOTE — Progress Notes (Unsigned)
Cardiology Office Note:    Date:  09/15/2022   ID:  Dennis English, DOB 06-25-2001, MRN [id number]  PCP:  Harden Mo, MD   [facility] Providers Cardiologist:   new - Lailany Enoch     Referring MD: Harden Mo, MD   Chief Complaint  Patient presents with   bicuspid aortic valve     History of Present Illness:    Dennis English is a 22 y.o. male with a hx of bicuspid AV . Seen with mother, Dennis English  Was followed at [facility] pediatric cardiology  Transferred here after the passing of Dr. Lorine Bears  Prevous echo from 2021 showed normal LV function  Bicuspid AV,  mild dilataton of his aorta  Healthy,  No syncope, no CP , no dyspnea  Has a HH ,  Eosinophilic esophagitic    Feb. 19, 2024 Dennis English is seen for follow up of his bicuspid AV  Having issues with swallowing .  Food gets stuck.   Has EOE     Past Medical History:  Diagnosis Date   ADHD (attention deficit hyperactivity disorder)    Asthma    Bicuspid aortic valve    Cardiac arrhythmia due to congenital heart disease    Eosinophilic esophagitis    GI bleed 02/2022   Headache(784.0)    MRSA (methicillin resistant Staphylococcus aureus)    age 52, in bloodstream per mother   Status post dilation of esophageal narrowing     Past Surgical History:  Procedure Laterality Date   ADENOIDECTOMY     HERNIA REPAIR     TONSILLECTOMY     TYMPANOSTOMY TUBE PLACEMENT     UPPER GASTROINTESTINAL ENDOSCOPY      Current Medications: Current Meds  Medication Sig   cyproheptadine (PERIACTIN) 4 MG tablet Take 4 mg by mouth 2 (two) times daily.   IRON CR PO Take by mouth.   ondansetron (ZOFRAN ODT) 4 MG disintegrating tablet Take 1 tablet (4 mg total) by mouth every 6 (six) hours as needed for nausea or vomiting.   pantoprazole (PROTONIX) 20 MG tablet Take 20 mg by mouth 2 (two) times daily.     Allergies:   Patient has no known allergies.   Social History   Socioeconomic History   Marital status: Single     Spouse name: Not on file   Number of children: 0   Years of education: Not on file   Highest education level: Not on file  Occupational History   Not on file  Tobacco Use   Smoking status: Never   Smokeless tobacco: Never  Vaping Use   Vaping Use: Never used  Substance and Sexual Activity   Alcohol use: Never   Drug use: Never   Sexual activity: Not on file  Other Topics Concern   Not on file  Social History Narrative   Not on file   Social Determinants of Health   Financial Resource Strain: Not on file  Food Insecurity: Not on file  Transportation Needs: Not on file  Physical Activity: Not on file  Stress: Not on file  Social Connections: Not on file     Family History: The patient's family history includes ADD / ADHD in his brother; Asthma in his mother; Barrett's esophagus in his mother; Clotting disorder in his sister; Colon cancer in his maternal grandmother; Colon polyps in his maternal grandmother and mother; Diabetes in his brother and maternal grandmother; Heart disease in his maternal grandfather and maternal grandmother. There is no history of  Esophageal cancer or Liver disease.  ROS:   Please see the history of present illness.     All other systems reviewed and are negative.  EKGs/Labs/Other Studies Reviewed:    The following studies were reviewed today: Records from dr. Bernerd Pho   EKG:    Recent Labs: 07/12/2022: BUN 7; Creatinine, Ser 0.71; Hemoglobin 15.0; Platelets 203; Potassium 3.9; Sodium 134  Recent Lipid Panel No results found for: "CHOL", "TRIG", "HDL", "CHOLHDL", "VLDL", "LDLCALC", "LDLDIRECT"   Risk Assessment/Calculations:          Physical Exam:    Physical Exam: Blood pressure 122/60, pulse 62, height 5' 6.5" (1.689 m), weight 114 lb 12.8 oz (52.1 kg), SpO2 99 %.       GEN:  Well nourished, well developed in no acute distress HEENT: Normal NECK: No JVD; No carotid bruits LYMPHATICS: No lymphadenopathy CARDIAC: RRR , very  soft systolic murmur  RESPIRATORY:  Clear to auscultation without rales, wheezing or rhonchi  ABDOMEN: Soft, non-tender, non-distended MUSCULOSKELETAL:  No edema; No deformity  SKIN: Warm and dry NEUROLOGIC:  Alert and oriented x 3   ECG: Normal sinus rhythm at 62.  Right atrial enlargement.  Inferior lateral Q waves.  ASSESSMENT:    1. Bicuspid aortic valve     PLAN:       Bicuspid aortic valve:    Airic is seen for follow up of his bicuspid AV .   He is doing well.   Valve sounds great  Will see him in 2 years.               Medication Adjustments/Labs and Tests Ordered: Current medicines are reviewed at length with the patient today.  Concerns regarding medicines are outlined above.  Orders Placed This Encounter  Procedures   EKG 12-Lead   No orders of the defined types were placed in this encounter.    Patient Instructions  Medication Instructions:  Your physician recommends that you continue on your current medications as directed. Please refer to the Current Medication list given to you today.  *If you need a refill on your cardiac medications before your next appointment, please call your pharmacy*   Lab Work: NONE If you have labs (blood work) drawn today and your tests are completely normal, you will receive your results only by: [place] (if you have MyChart) OR A paper copy in the mail If you have any lab test that is abnormal or we need to change your treatment, we will call you to review the results.   Testing/Procedures: NONE   Follow-Up: At [facility], you and your health needs are our priority.  As part of our continuing mission to provide you with exceptional heart care, we have created designated Provider Care Teams.  These Care Teams include your primary Cardiologist (physician) and Advanced Practice Providers (APPs -  Physician Assistants and Nurse Practitioners) who all work together to provide you with the care you  need, when you need it.  We recommend signing up for the patient portal called "MyChart".  Sign up information is provided on this After Visit Summary.  MyChart is used to connect with patients for Virtual Visits (Telemedicine).  Patients are able to view lab/test results, encounter notes, upcoming appointments, etc.  Non-urgent messages can be sent to your provider as well.   To learn more about what you can do with MyChart, go to [email or web address].    Your next appointment:   2 year(s)  Provider:  Christen Bame, NP     Signed, Mertie Moores, MD  09/15/2022 4:51 PM    [facility]

## 2022-09-15 ENCOUNTER — Ambulatory Visit: Payer: Medicaid Other | Attending: Cardiovascular Disease | Admitting: Cardiovascular Disease

## 2022-09-15 ENCOUNTER — Encounter: Payer: Self-pay | Admitting: Cardiovascular Disease

## 2022-09-15 VITALS — BP 122/60 | HR 62 | Ht 66.5 in | Wt 114.8 lb

## 2022-09-15 DIAGNOSIS — Q231 Congenital insufficiency of aortic valve: Secondary | ICD-10-CM | POA: Diagnosis not present

## 2022-09-15 NOTE — Patient Instructions (Signed)
Medication Instructions:  Your physician recommends that you continue on your current medications as directed. Please refer to the Current Medication list given to you today.  *If you need a refill on your cardiac medications before your next appointment, please call your pharmacy*   Lab Work: NONE If you have labs (blood work) drawn today and your tests are completely normal, you will receive your results only by: [place] (if you have MyChart) OR A paper copy in the mail If you have any lab test that is abnormal or we need to change your treatment, we will call you to review the results.   Testing/Procedures: NONE   Follow-Up: At [facility], you and your health needs are our priority.  As part of our continuing mission to provide you with exceptional heart care, we have created designated Provider Care Teams.  These Care Teams include your primary Cardiologist (physician) and Advanced Practice Providers (APPs -  Physician Assistants and Nurse Practitioners) who all work together to provide you with the care you need, when you need it.  We recommend signing up for the patient portal called "MyChart".  Sign up information is provided on this After Visit Summary.  MyChart is used to connect with patients for Virtual Visits (Telemedicine).  Patients are able to view lab/test results, encounter notes, upcoming appointments, etc.  Non-urgent messages can be sent to your provider as well.   To learn more about what you can do with MyChart, go to [email or web address].    Your next appointment:   2 year(s)  Provider:   Christen Bame, NP

## 2022-09-23 ENCOUNTER — Encounter: Payer: Self-pay | Admitting: Internal Medicine

## 2022-09-29 ENCOUNTER — Encounter: Payer: Self-pay | Admitting: Internal Medicine
